# Patient Record
Sex: Female | Born: 2000 | Race: White | Hispanic: No | Marital: Single | Smoking: Never smoker
Health system: Southern US, Community
[De-identification: ages and names within clinical notes are randomized; demographics above are authoritative.]

## PROBLEM LIST (undated history)

## (undated) DIAGNOSIS — D649 Anemia, unspecified: Secondary | ICD-10-CM

---
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
## 2019-04-12 IMAGING — CR DG KNEE COMPLETE 4+V*R*
4 series · 4 of 4 positions shown · non-contrast
Comparison: None.

CLINICAL DATA: Right ankle and knee pain

EXAM:
RIGHT KNEE - COMPLETE 4+ VIEW

[t knee ap right]
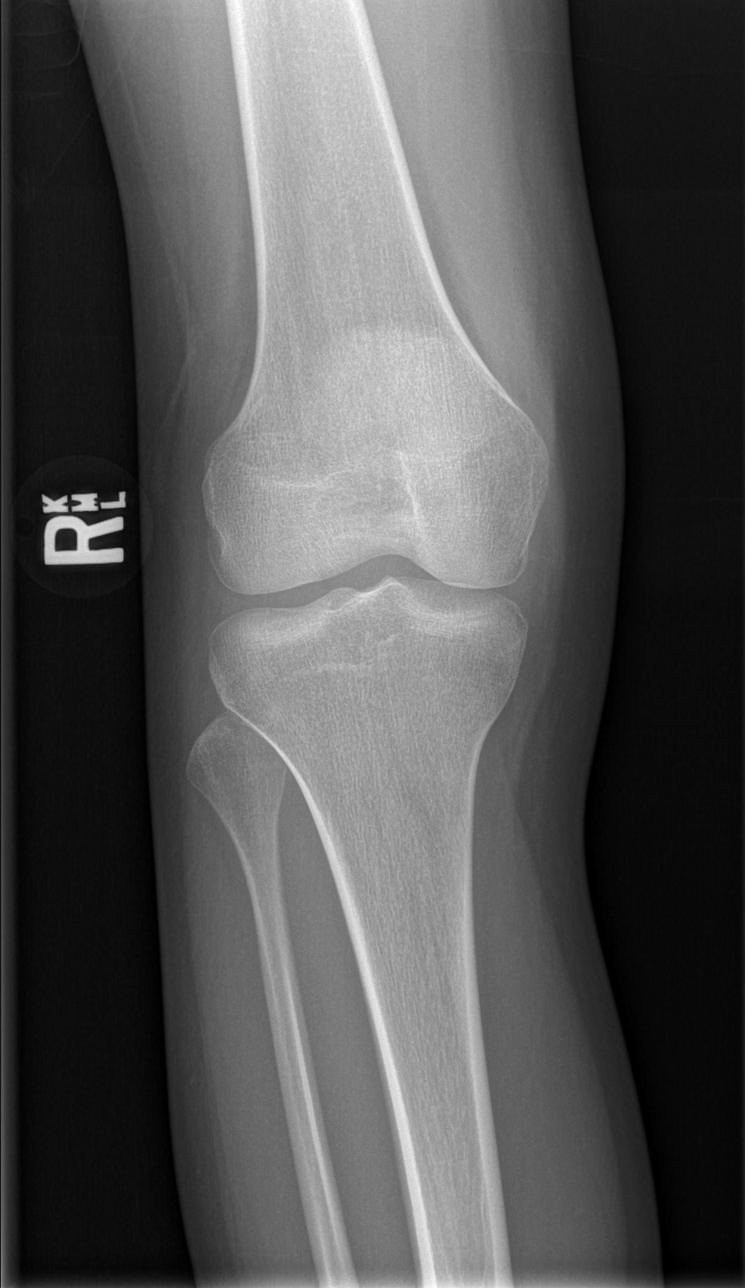

[t knee obl right (1 of 2)]
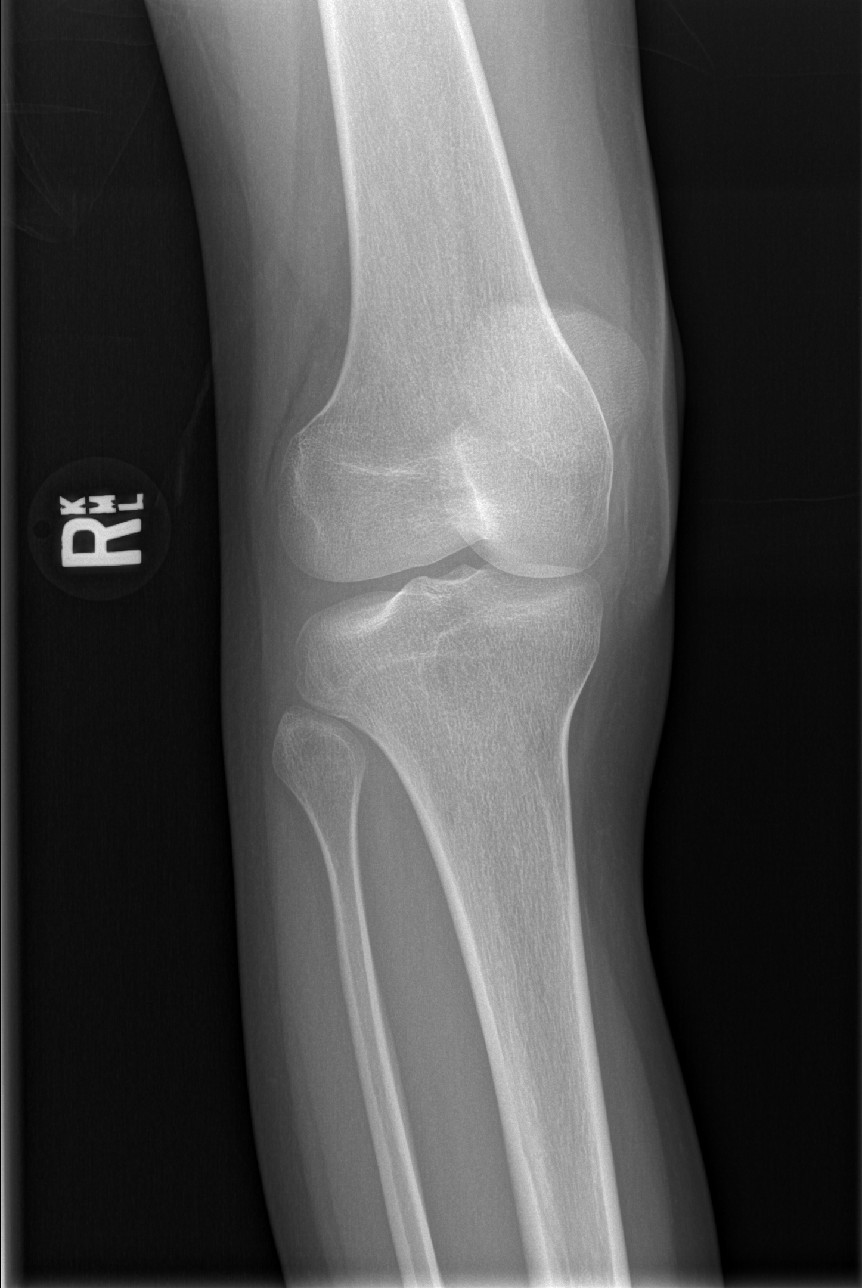

[t knee obl right (2 of 2)]
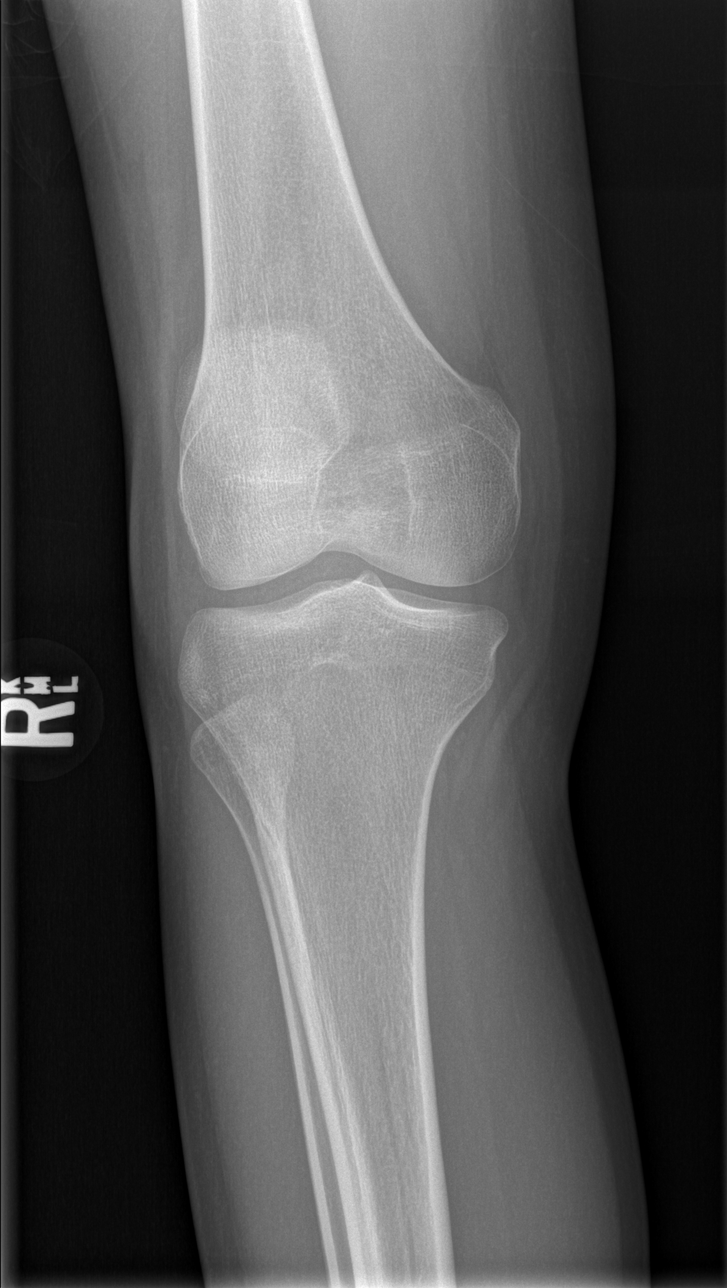

[t knee lat right]
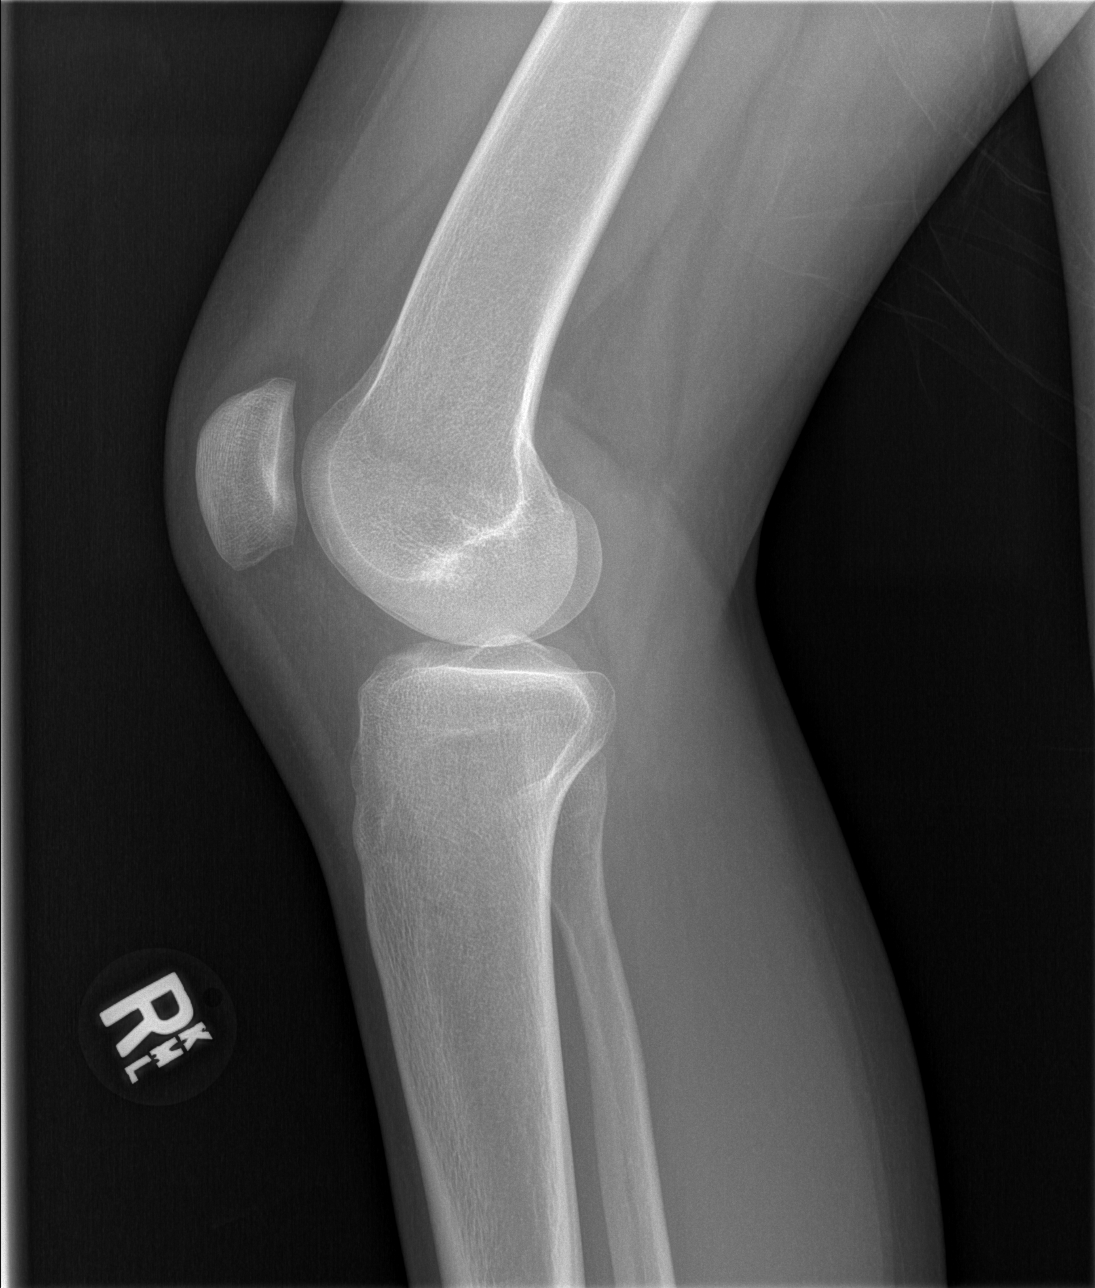

[4 of 4 positions shown; findings below may reference images not displayed]

FINDINGS: No evidence of fracture, dislocation, or joint effusion. No evidence
of arthropathy or other focal bone abnormality. Soft tissues are
unremarkable.
IMPRESSION: Negative.

## 2019-08-07 ENCOUNTER — Other Ambulatory Visit: Payer: Self-pay

## 2019-08-07 DIAGNOSIS — Z20822 Contact with and (suspected) exposure to covid-19: Secondary | ICD-10-CM

## 2019-08-08 ENCOUNTER — Telehealth: Payer: Self-pay | Admitting: *Deleted

## 2019-08-08 LAB — NOVEL CORONAVIRUS, NAA: SARS-CoV-2, NAA: NOT DETECTED

## 2019-08-08 NOTE — Telephone Encounter (Signed)
She called in requesting COVID-19 test result.    It wasn't ready.     I e mailed her a link to set up her MyChart.

## 2022-07-22 ENCOUNTER — Emergency Department (HOSPITAL_COMMUNITY)
Admission: EM | Admit: 2022-07-22 | Discharge: 2022-07-22 | Disposition: A | Discharge status: Home / Self Care | Payer: 59 | Attending: Emergency Medicine | Admitting: Emergency Medicine

## 2022-07-22 ENCOUNTER — Other Ambulatory Visit: Payer: Self-pay

## 2022-07-22 DIAGNOSIS — M545 Low back pain, unspecified: Secondary | ICD-10-CM | POA: Diagnosis present

## 2022-07-22 DIAGNOSIS — R200 Anesthesia of skin: Secondary | ICD-10-CM | POA: Diagnosis not present

## 2022-07-22 DIAGNOSIS — R202 Paresthesia of skin: Secondary | ICD-10-CM | POA: Insufficient documentation

## 2022-07-22 DIAGNOSIS — Z91040 Latex allergy status: Secondary | ICD-10-CM | POA: Insufficient documentation

## 2022-07-22 LAB — URINALYSIS, ROUTINE W REFLEX MICROSCOPIC
Bilirubin Urine: NEGATIVE
Glucose, UA: NEGATIVE mg/dL
Hgb urine dipstick: NEGATIVE
Ketones, ur: NEGATIVE mg/dL
Leukocytes,Ua: NEGATIVE
Nitrite: NEGATIVE
Protein, ur: NEGATIVE mg/dL
Specific Gravity, Urine: 1.002 — ABNORMAL LOW (ref 1.005–1.030)
pH: 7 (ref 5.0–8.0)

## 2022-07-22 LAB — PREGNANCY, URINE: Preg Test, Ur: NEGATIVE

## 2022-07-22 MED ORDER — CYCLOBENZAPRINE HCL 10 MG PO TABS: 10.0000 mg | ORAL_TABLET | Freq: Two times a day (BID) | ORAL | 0 refills | Status: AC | PRN

## 2022-07-22 MED ORDER — NAPROXEN 500 MG PO TABS: 500.0000 mg | ORAL_TABLET | Freq: Two times a day (BID) | ORAL | 0 refills | Status: AC

## 2022-07-22 MED ORDER — KETOROLAC TROMETHAMINE 30 MG/ML IJ SOLN
30.0000 mg | Freq: Once | INTRAMUSCULAR | Status: AC
  Administered 2022-07-22: 30 mg via INTRAMUSCULAR
  Filled 2022-07-22: qty 1

## 2022-07-22 NOTE — ED Triage Notes (Signed)
Patient coming to ED for evaluation of back pain.  Hx of spinal fx in 2021 (T9-L1).  C/o numbness in back and shoulders.  Pain worse with walking.  Weakness in bilateral legs.  States "it feels like I am struggling to hold my urine more and more now."

## 2022-07-22 NOTE — ED Provider Notes (Signed)
[facility] DEPT Provider Note   CSN: [id number] Arrival date & time: 07/22/22  0113     History  Chief Complaint  Patient presents with   Back Pain    Alexandra [name] is a 21 y.o. female.  Patient is presenting to the ED with lower back pain.  She says it is bilateral.  She says she has had back pain for the past 2 years, but over the past week she says its gotten worse.  She has noticed some sharp pains on both the right and the left side.  She is also noticed some numbness and tingling going up her back.  She has also noticed increased frequency in urination.  She reports chronic numbness and tingling in her lower extremities that is unchanged from baseline.  She denies any ataxic gait, urinary incontinence, urinary retention, bowel dysfunction, fevers, chills, recent trauma, hematuria, dysuria.   Back Pain      Home Medications Prior to Admission medications   Medication Sig Start Date End Date Taking? Authorizing Provider  cyclobenzaprine (FLEXERIL) 10 MG tablet Take 1 tablet (10 mg total) by mouth 2 (two) times daily as needed for up to 7 days for muscle spasms. 07/22/22 07/29/22 Yes Deyci Gesell, Adora Fridge, PA-C  naproxen (NAPROSYN) 500 MG tablet Take 1 tablet (500 mg total) by mouth 2 (two) times daily for 7 days. 07/22/22 07/29/22 Yes Maurion Walkowiak, Adora Fridge, PA-C  albuterol (VENTOLIN HFA) 108 (90 BASE) MCG/ACT inhaler Inhale 2 puffs into the lungs every 6 (six) hours as needed. For wheezing and shortness of breath     [provider]  montelukast (SINGULAIR) 5 MG chewable tablet Chew 5 mg by mouth at bedtime.      [provider]  Multiple Vitamins-Minerals (MULTIVITAMINS THER. W/MINERALS) TABS Take 1 tablet by mouth daily.      [provider]      Allergies    Amoxicillin, Latex, and Omnicef [cefdinir]    Review of Systems   Review of Systems  Musculoskeletal:  Positive for back pain.  All other systems reviewed and are  negative.   Physical Exam Updated Vital Signs BP (!) 119/90 (BP Location: Right Arm)   Pulse 81   Temp 98.1 F (36.7 C) (Oral)   Resp 16   Ht 5\' 1"  (1.549 m)   Wt 44.9 kg   SpO2 100%   BMI 18.71 kg/m  Physical Exam Vitals and nursing note reviewed.  Constitutional:      General: She is not in acute distress.    Appearance: Normal appearance. She is well-developed. She is not ill-appearing, toxic-appearing or diaphoretic.  HENT:     Head: Normocephalic and atraumatic.     Nose: No nasal deformity.     Mouth/Throat:     Lips: Pink. No lesions.  Eyes:     General: Gaze aligned appropriately. No scleral icterus.       Right eye: No discharge.        Left eye: No discharge.     Conjunctiva/sclera: Conjunctivae normal.     Right eye: Right conjunctiva is not injected. No exudate or hemorrhage.    Left eye: Left conjunctiva is not injected. No exudate or hemorrhage. Pulmonary:     Effort: Pulmonary effort is normal. No respiratory distress.  Musculoskeletal:     Comments: No midline tenderness of spine, no stepoff or deformity; reproducible muscular tenderness in paraspinal muscles DP/PT pulses 2+ and equal bilaterally No leg edema Sensation grossly intact  on anterior thighs, dorsum of foot and lateral foot Strength of knee flexion and extension is 5/5 Plantar and dorsiflexion of ankle 5/5 patellar reflexes present and equal Gait normal  Skin:    General: Skin is warm and dry.  Neurological:     Mental Status: She is alert and oriented to person, place, and time.  Psychiatric:        Mood and Affect: Mood normal.        Speech: Speech normal.        Behavior: Behavior normal. Behavior is cooperative.     ED Results / Procedures / Treatments   Labs (all labs ordered are listed, but only abnormal results are displayed) Labs Reviewed  URINALYSIS, ROUTINE W REFLEX MICROSCOPIC - Abnormal; Notable for the following components:      Result Value   Color, Urine COLORLESS  (*)    Specific Gravity, Urine 1.002 (*)    All other components within normal limits  PREGNANCY, URINE    EKG None  Radiology No results found.  Procedures Procedures   Medications Ordered in ED Medications  ketorolac (TORADOL) 30 MG/ML injection 30 mg (has no administration in time range)    ED Course/ Medical Decision Making/ A&P                           Medical Decision Making Amount and/or Complexity of Data Reviewed Labs: ordered.  Risk Prescription drug management.   Patient is presenting with acute on chronic lower back pain that has been worsening this week.  She has equal 5 out of 5 strength and equal sensation bilateral lower extremities.  She is ambulated to the bathroom without any ataxic gait or any difficulties.  She has been able to urinate without any difficulties, or signs of urinary retention.  She had a urine sample that was negative for RBCs and bacteria.  She had negative pregnancy test.  Doubt kidney stone or UTI. Suspect musculoskeletal.  There is no evidence of cauda equina or any spinal cord involvement on history or exam.  MRI is not indicated today.  She currently lives in [place] and has seen orthopedic provider in the past. She can follow up with them. Stable for discharge  Final Clinical Impression(s) / ED Diagnoses Final diagnoses:  Low back pain, unspecified back pain laterality, unspecified chronicity, unspecified whether sciatica present    Rx / DC Orders ED Discharge Orders          Ordered    naproxen (NAPROSYN) 500 MG tablet  2 times daily        07/22/22 0338    cyclobenzaprine (FLEXERIL) 10 MG tablet  2 times daily PRN        07/22/22 0338              Claudie Leach, PA-C 07/22/22 0346    Palumbo, April, MD 07/22/22 [phone]

## 2022-07-22 NOTE — Discharge Instructions (Signed)
You have been prescribed an antiinflammatory and a muscle relaxant.  You were given a prescription for Cyclobenzaprine which is a muscle relaxer.  You should not drive, work, consume alcohol, or operate machinery while taking this medication as it can make you very drowsy.     Please call [facility] for follow up appointment to further evaluation your back pain.  Back Pain:  Back pain is very common.  The pain often gets better over time.  The cause of back pain is usually not dangerous.  Most people can learn to manage their back pain on their own.  However if you develop severe or worsening pain, low back pain with fever, numbness, weakness or inability to walk or urinate/stool, you should return to the ER immediately.  Please follow up with your doctor this week for a recheck if still having symptoms.  Low back pain is discomfort in the lower back that may be due to injuries to muscles and ligaments around the spine.  Occasionally, it may be caused by a a problem to a part of the spine called a disc. The pain may last several days or a week;  However, most patients get completely well in 4 weeks.  Medications are also useful to help with pain control.  A commonly prescribed medications includes acetaminophen.  This medication is generally safe, though you should not take more than 8 of the extra strength (500mg ) pills a day.  Non steroidal anti inflammatory medications including Ibuprofen and naproxen;  These medications help both pain and swelling and are very useful in treating back pain.  They should be taken with food, as they can cause stomach upset, and more seriously, stomach bleeding.    Be aware that if you develop new symptoms, such as a fever, leg weakness, difficulty with or loss of control of your urine or bowels, abdominal pain, or more severe pain, you will need to seek medical attention and  / or return to the Emergency department.    Home Care Stay active.  Start with  short walks on flat ground if you can.  Try to walk farther each day. Do not sit, drive or stand in one place for more than 30 minutes.  Do not stay in bed. Do not avoid exercise or work.  Activity can help your back heal faster. Be careful when you bend or lift an object.  Bend at your knees, keep the object close to you, and do not twist. Sleep on a firm mattress.  Lie on your side, and bend your knees.  If you lie on your back, put a pillow under your knees. Only take medicines as told by your doctor. Put ice on the injured area. Put ice in a plastic bag Place a towel between your skin and the bag Leave the ice on for 15-20 minutes, 3-4 times a day for the first 2-3 days. 210 After that, you can switch between ice and heat packs. Ask your doctor about back exercises or massage. Avoid feeling anxious or stressed.  Find good ways to deal with stress, such as exercise.  Get Help Right Way If: Your pain does not go away with rest or medicine. Your pain does not go away in 1 week. You have new problems. You do not feel well. The pain spreads into your legs. You cannot control when you poop (bowel movement) or pee (urinate) You feel sick to your stomach (nauseous) or throw up (vomit) You have belly (abdominal) pain.  You feel like you may pass out (faint). If you develop a fever.  Make Sure you: Understand these instructions. Watch your condition Get help right away if you are not doing well or get worse.  Managing Pain  Pain after surgery or related to activity is often due to strain/injury to muscle, tendon, nerves and/or incisions.  This pain is usually short-term and will improve in a few months.   Many people find it helpful to do the following things TOGETHER to help speed the process of healing and to get back to regular activity more quickly:  Avoid heavy physical activity  no lifting greater than 20 pounds Do not "push through" the pain.  Listen to your body and avoid  positions and maneuvers than reproduce the pain Walking is okay as tolerated, but go slowly and stop when getting sore.  Remember: If it hurts to do it, then don't do it! Take Anti-inflammatory medication  Take with food/snack around the clock for 1-2 weeks This helps the muscle and nerve tissues become less irritable and calm down faster Choose ONE of the following over-the-counter medications: Naproxen 220mg  tabs (ex. Aleve) 1-2 pills twice a day  Ibuprofen 200mg  tabs (ex. Advil, Motrin) 3-4 pills with every meal and just before bedtime Acetaminophen 500mg  tabs (Tylenol) 1-2 pills with every meal and just before bedtime Use a Heating pad or Ice/Cold Pack 4-6 times a day May use warm bath/hottub  or showers Try Gentle Massage and/or Stretching  at the area of pain many times a day stop if you feel pain - do not overdo it  Try these steps together to help you body heal faster and avoid making things get worse.  Doing just one of these things may not be enough.    If you are not getting better after two weeks or are noticing you are getting worse, contact our office for further advice; we may need to re-evaluate you & see what other things we can do to help.

## 2022-07-22 NOTE — ED Notes (Signed)
Pt ambulated to restroom unassisted 

## 2022-07-22 NOTE — ED Notes (Signed)
Mother reports she was recently dx with MS and is concerned that patient might be exhibiting some of the same symptoms

## 2024-07-11 ENCOUNTER — Emergency Department (HOSPITAL_COMMUNITY)
Admission: EM | Admit: 2024-07-11 | Discharge: 2024-07-11 | Disposition: A | Discharge status: Home / Self Care | Attending: Emergency Medicine | Admitting: Emergency Medicine

## 2024-07-11 ENCOUNTER — Other Ambulatory Visit: Payer: Self-pay

## 2024-07-11 ENCOUNTER — Emergency Department (HOSPITAL_COMMUNITY)

## 2024-07-11 DIAGNOSIS — Y92009 Unspecified place in unspecified non-institutional (private) residence as the place of occurrence of the external cause: Secondary | ICD-10-CM | POA: Diagnosis not present

## 2024-07-11 DIAGNOSIS — W010XXA Fall on same level from slipping, tripping and stumbling without subsequent striking against object, initial encounter: Secondary | ICD-10-CM | POA: Diagnosis not present

## 2024-07-11 DIAGNOSIS — Z91040 Latex allergy status: Secondary | ICD-10-CM | POA: Diagnosis not present

## 2024-07-11 DIAGNOSIS — S299XXA Unspecified injury of thorax, initial encounter: Secondary | ICD-10-CM | POA: Diagnosis present

## 2024-07-11 DIAGNOSIS — S20211A Contusion of right front wall of thorax, initial encounter: Secondary | ICD-10-CM | POA: Insufficient documentation

## 2024-07-11 DIAGNOSIS — S298XXA Other specified injuries of thorax, initial encounter: Secondary | ICD-10-CM

## 2024-07-11 MED ORDER — OXYCODONE-ACETAMINOPHEN 5-325 MG PO TABS
1.0000 | ORAL_TABLET | Freq: Once | ORAL | Status: AC
  Administered 2024-07-11: 1 via ORAL
  Filled 2024-07-11: qty 1

## 2024-07-11 MED ORDER — NAPROXEN 250 MG PO TABS
500.0000 mg | ORAL_TABLET | Freq: Once | ORAL | Status: AC
  Administered 2024-07-11: 500 mg via ORAL
  Filled 2024-07-11: qty 2

## 2024-07-11 NOTE — ED Triage Notes (Signed)
 Pt slipped and fell onto coffee table this evening and complaining of right rib pain.

## 2024-07-11 NOTE — ED Provider Notes (Signed)
 [place] EMERGENCY DEPARTMENT AT [facility] Provider Note   CSN: [id number] Arrival date & time: 07/11/24  1941     Patient presents with: Rib Injury   Alexandra [name] is a 23 y.o. female.   The history is provided by the patient.   She tripped and fell at home and landed on the corner of a table suffering an injury to her right rib cage.  She is complaining of a sharp pain in that area.  She denies other injury.    Prior to Admission medications   Medication Sig Start Date End Date Taking? Authorizing Provider  albuterol (VENTOLIN HFA) 108 (90 BASE) MCG/ACT inhaler Inhale 2 puffs into the lungs every 6 (six) hours as needed. For wheezing and shortness of breath     [provider]  montelukast (SINGULAIR) 5 MG chewable tablet Chew 5 mg by mouth at bedtime.      [provider]  Multiple Vitamins-Minerals (MULTIVITAMINS THER. W/MINERALS) TABS Take 1 tablet by mouth daily.      [provider]    Allergies: Amoxicillin, Latex, and Omnicef [cefdinir]    Review of Systems  All other systems reviewed and are negative.   Updated Vital Signs BP 100/79   Pulse 93   Temp 98.5 F (36.9 C)   Resp 18   Ht 5' 1 (1.549 m)   Wt 45.4 kg   LMP 07/04/2024 (Exact Date)   SpO2 100%   BMI 18.89 kg/m   Physical Exam Vitals and nursing note reviewed.   23 year old female, resting comfortably and in no acute distress. Vital signs are normal. Oxygen saturation is 100%, which is normal. Head is normocephalic and atraumatic. Neck is nontender. Back is nontender and there is no CVA tenderness. Lungs are clear without rales, wheezes, or rhonchi. Chest: There is an abrasion along the right lateral chest wall with tenderness over that same area.  There is no crepitus. Heart has regular rate and rhythm without murmur. Abdomen is soft, flat, nontender. Extremities have no swelling or deformity, full range of motion is present. Skin is warm and dry  without rash. Neurologic: Mental status is normal, cranial nerves are intact, moves all extremities equally.     Radiology: DG Ribs Unilateral W/Chest Right Result Date: 07/11/2024 EXAM: AP VIEW(S) XRAY OF THE RIGHT RIBS AND CHEST 07/11/2024 07:58:00 PM COMPARISON: PA and lateral chest 10/26/2009. CLINICAL HISTORY: rib injury rib injury FINDINGS: BONES: No acute displaced rib fracture. LUNGS AND PLEURA: No consolidation or pulmonary edema. No pleural effusion or pneumothorax. HEART AND MEDIASTINUM: No acute abnormality of the cardiac and mediastinal silhouettes. IMPRESSION: 1. No acute displaced  rib fracture. 2. No acute cardiopulmonary findings. Electronically signed by: Francis Quam MD 07/11/2024 08:06 PM EST RP Workstation: [id number]     Procedures   Medications Ordered in the ED  oxyCODONE-acetaminophen (PERCOCET/ROXICET) 5-325 MG per tablet 1 tablet (has no administration in time range)  naproxen  (NAPROSYN ) tablet 500 mg (has no administration in time range)                                    Medical Decision Making Amount and/or Complexity of Data Reviewed Radiology: ordered.   Injury to right chest wall.  X-rays show no evidence of fracture.  I have independently viewed the images, and agree with the radiologist's interpretation.  I have advised the patient on ice and use  aware of over-the-counter NSAIDs and acetaminophen.     Final diagnoses:  Contusion of rib on right side, initial encounter    ED Discharge Orders     None          Raford Lenis, MD 07/11/24 2327

## 2024-07-11 NOTE — Discharge Instructions (Signed)
 Apply ice for 30 minutes at a time, 4 times a day.  Take ibuprofen  or naproxen  as needed for pain.  To get additional pain relief, add acetaminophen.  When you combine acetaminophen with either ibuprofen  or naproxen , you will get better pain relief and you get from taking either medication by itself.
